# Patient Record
Sex: Male | Born: 1995 | Race: Black or African American | Hispanic: No | Marital: Single
Health system: Southern US, Community
[De-identification: ages and names within clinical notes are randomized; demographics above are authoritative.]

## PROBLEM LIST (undated history)

## (undated) DIAGNOSIS — W3400XA Accidental discharge from unspecified firearms or gun, initial encounter: Secondary | ICD-10-CM

## (undated) DIAGNOSIS — J45909 Unspecified asthma, uncomplicated: Secondary | ICD-10-CM

## (undated) DIAGNOSIS — I10 Essential (primary) hypertension: Secondary | ICD-10-CM

## (undated) HISTORY — PX: BLADDER SURGERY: SHX569

## (undated) HISTORY — PX: ABDOMINAL SURGERY: SHX537

---
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
## 2015-11-02 ENCOUNTER — Emergency Department
Admission: EM | Admit: 2015-11-02 | Discharge: 2015-11-02 | Disposition: A | Discharge status: Home / Self Care | Payer: Self-pay | Attending: Emergency Medicine | Admitting: Emergency Medicine

## 2015-11-02 ENCOUNTER — Encounter: Payer: Self-pay | Admitting: Emergency Medicine

## 2015-11-02 DIAGNOSIS — J029 Acute pharyngitis, unspecified: Secondary | ICD-10-CM | POA: Insufficient documentation

## 2015-11-02 DIAGNOSIS — F17210 Nicotine dependence, cigarettes, uncomplicated: Secondary | ICD-10-CM | POA: Insufficient documentation

## 2015-11-02 DIAGNOSIS — H9203 Otalgia, bilateral: Secondary | ICD-10-CM | POA: Insufficient documentation

## 2015-11-02 DIAGNOSIS — J45909 Unspecified asthma, uncomplicated: Secondary | ICD-10-CM | POA: Insufficient documentation

## 2015-11-02 HISTORY — DX: Unspecified asthma, uncomplicated: J45.909

## 2015-11-02 MED ORDER — MAGIC MOUTHWASH: 5.0000 mL | Freq: Four times a day (QID) | ORAL | 0 refills | Status: DC

## 2015-11-02 NOTE — ED Triage Notes (Signed)
Pt presents today and c/o cough, sore throat and swollen tonsils, and otalgia. Pt states symptoms that started approx 1 week ago but states sore throat started today. Pt in no acute distress at this time, able to maintain his own saliva and speaks with a clear voice.

## 2015-11-02 NOTE — ED Provider Notes (Signed)
[facility] Emergency Department Provider Note  ____________________________________________   First MD Initiated Contact with Patient 11/02/15 1554     (approximate)  I have reviewed the triage vital signs and the nursing notes.   HISTORY  Chief Complaint Sore Throat; Cough; and Otalgia   HPI Cory Chaney is a 20 y.o. male is here with complaint of cough, congestion and bilateral ear pain that started approximately one week ago. Patient states that today his sore throat began. He has continued to be able to swallow his own saliva, talk, eat and drink. Patient is unaware of any fever or chills. There's been no nausea or vomiting. Patient has taken Benadryl once without any relief. He rates his pain as 6/10.   Past Medical History:  Diagnosis Date  . Asthma     There are no active problems to display for this patient.   History reviewed. No pertinent surgical history.  Prior to Admission medications   Medication Sig Start Date End Date Taking? Authorizing Provider  magic mouthwash SOLN Take 5 mLs by mouth 4 (four) times daily. 11/02/15   Tommi Rumps, PA-C    Allergies Review of patient's allergies indicates no known allergies.  History reviewed. No pertinent family history.  Social History Social History  Substance Use Topics  . Smoking status: Current Every Day Smoker    Packs/day: 0.50    Types: Cigarettes  . Smokeless tobacco: Never Used  . Alcohol use 1.2 oz/week    2 Cans of beer per week    Review of Systems Constitutional: No fever/chills Eyes: No visual changes. ENT: Positive sore throat. Positive bilateral ear pain. Cardiovascular: Denies chest pain. Respiratory: Denies shortness of breath. Positive nonproductive cough. Gastrointestinal: No abdominal pain.  No nausea, no vomiting.   Musculoskeletal: Negative for back pain. Skin: Negative for rash. Neurological: Negative for headaches  10-point ROS otherwise  negative.  ____________________________________________   PHYSICAL EXAM:  VITAL SIGNS: ED Triage Vitals  Enc Vitals Group     BP 11/02/15 1541 127/76     Pulse Rate 11/02/15 1541 79     Resp 11/02/15 1541 16     Temp 11/02/15 1541 98.4 F (36.9 C)     Temp src --      SpO2 11/02/15 1541 100 %     Weight 11/02/15 1542 140 lb (63.5 kg)     Height 11/02/15 1542 5\' 11"  (1.803 m)     Head Circumference --      Peak Flow --      Pain Score 11/02/15 1551 6     Pain Loc --      Pain Edu? --      Excl. in GC? --     Constitutional: Alert and oriented. Well appearing and in no acute distress. Eyes: Conjunctivae are normal. PERRL. EOMI. Head: Atraumatic. Nose: Minimal congestion/no rhinnorhea.  EACs and TMs are clear bilaterally. Mouth/Throat: Mucous membranes are moist.  Oropharynx non-erythematous, no exudate. Mild posterior drainage. Neck: No stridor.   Hematological/Lymphatic/Immunilogical: No cervical lymphadenopathy. Cardiovascular: Normal rate, regular rhythm. Grossly normal heart sounds.  Good peripheral circulation. Respiratory: Normal respiratory effort.  No retractions. Lungs CTAB. Musculoskeletal: Moves upper and lower extremities without any difficulty. Normal gait was noted. Neurologic:  Normal speech and language. No gross focal neurologic deficits are appreciated. No gait instability. Skin:  Skin is warm, dry and intact. No rash noted. Psychiatric: Mood and affect are normal. Speech and behavior are normal.  ____________________________________________  LABS (all labs ordered are listed, but only abnormal results are displayed)  Labs Reviewed - No data to display   PROCEDURES  Procedure(s) performed: None  Procedures  Critical Care performed: No  ____________________________________________   INITIAL IMPRESSION / ASSESSMENT AND PLAN / ED COURSE  Pertinent labs & imaging results that were available during my care of the patient were reviewed by me  and considered in my medical decision making (see chart for details).    Clinical Course   Patient is aware that his strep test is negative. Patient was given a prescription for magic mouthwash to use for his throat pain. He is encouraged to continue taking Benadryl or more  preferably over-the-counter Claritin once a day. He is to [facility] if any continued problems.  ____________________________________________   FINAL CLINICAL IMPRESSION(S) / ED DIAGNOSES  Final diagnoses:  Viral pharyngitis      NEW MEDICATIONS STARTED DURING THIS VISIT:  Discharge Medication List as of 11/02/2015  4:43 PM    START taking these medications   Details  magic mouthwash SOLN Take 5 mLs by mouth 4 (four) times daily., Starting Sun 11/02/2015, Print         Note:  This document was prepared using Dragon voice recognition software and may include unintentional dictation errors.    Tommi Rumpshonda L Manahil Vanzile, PA-C 11/02/15 1846    Sharyn CreamerMark Quale, MD 11/08/15 1535

## 2015-11-02 NOTE — ED Notes (Signed)
NEGATIVE strep test

## 2015-11-02 NOTE — Discharge Instructions (Signed)
Follow[facility] if any continued problems. Tylenol or ibuprofen if needed for throat pain. Increase fluids. Magic mouthwash as needed for throat pain.

## 2016-03-30 DIAGNOSIS — W3400XA Accidental discharge from unspecified firearms or gun, initial encounter: Secondary | ICD-10-CM

## 2016-03-30 HISTORY — DX: Accidental discharge from unspecified firearms or gun, initial encounter: W34.00XA

## 2016-04-11 ENCOUNTER — Emergency Department (HOSPITAL_COMMUNITY)
Admission: EM | Admit: 2016-04-11 | Discharge: 2016-04-11 | Disposition: A | Discharge status: Home / Self Care | Payer: Self-pay | Attending: Emergency Medicine | Admitting: Emergency Medicine

## 2016-04-11 ENCOUNTER — Encounter (HOSPITAL_COMMUNITY): Payer: Self-pay | Admitting: Emergency Medicine

## 2016-04-11 DIAGNOSIS — L7622 Postprocedural hemorrhage and hematoma of skin and subcutaneous tissue following other procedure: Secondary | ICD-10-CM | POA: Insufficient documentation

## 2016-04-11 DIAGNOSIS — J45909 Unspecified asthma, uncomplicated: Secondary | ICD-10-CM | POA: Insufficient documentation

## 2016-04-11 DIAGNOSIS — T148XXA Other injury of unspecified body region, initial encounter: Secondary | ICD-10-CM

## 2016-04-11 DIAGNOSIS — F17210 Nicotine dependence, cigarettes, uncomplicated: Secondary | ICD-10-CM | POA: Insufficient documentation

## 2016-04-11 NOTE — ED Notes (Signed)
Pt and family understood dc material. NAD noted 
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
## 2016-05-04 ENCOUNTER — Encounter (HOSPITAL_COMMUNITY): Payer: Self-pay

## 2016-05-04 ENCOUNTER — Emergency Department (HOSPITAL_COMMUNITY): Payer: Self-pay

## 2016-05-04 ENCOUNTER — Emergency Department (HOSPITAL_COMMUNITY)
Admission: EM | Admit: 2016-05-04 | Discharge: 2016-05-04 | Disposition: A | Discharge status: Other Acute Inpatient Hospital | Payer: Self-pay | Attending: Emergency Medicine | Admitting: Emergency Medicine

## 2016-05-04 DIAGNOSIS — F17210 Nicotine dependence, cigarettes, uncomplicated: Secondary | ICD-10-CM | POA: Insufficient documentation

## 2016-05-04 DIAGNOSIS — W3400XD Accidental discharge from unspecified firearms or gun, subsequent encounter: Secondary | ICD-10-CM | POA: Insufficient documentation

## 2016-05-04 DIAGNOSIS — S31109D Unspecified open wound of abdominal wall, unspecified quadrant without penetration into peritoneal cavity, subsequent encounter: Secondary | ICD-10-CM | POA: Insufficient documentation

## 2016-05-04 DIAGNOSIS — L02214 Cutaneous abscess of groin: Secondary | ICD-10-CM | POA: Insufficient documentation

## 2016-05-04 DIAGNOSIS — S31139S Puncture wound of abdominal wall without foreign body, unspecified quadrant without penetration into peritoneal cavity, sequela: Secondary | ICD-10-CM

## 2016-05-04 DIAGNOSIS — Z7982 Long term (current) use of aspirin: Secondary | ICD-10-CM | POA: Insufficient documentation

## 2016-05-04 DIAGNOSIS — W3400XS Accidental discharge from unspecified firearms or gun, sequela: Secondary | ICD-10-CM

## 2016-05-04 DIAGNOSIS — J45909 Unspecified asthma, uncomplicated: Secondary | ICD-10-CM | POA: Insufficient documentation

## 2016-05-04 HISTORY — DX: Accidental discharge from unspecified firearms or gun, initial encounter: W34.00XA

## 2016-05-04 LAB — BASIC METABOLIC PANEL
Anion gap: 14 (ref 5–15)
BUN: 9 mg/dL (ref 6–20)
CHLORIDE: 102 mmol/L (ref 101–111)
CO2: 22 mmol/L (ref 22–32)
Calcium: 9.7 mg/dL (ref 8.9–10.3)
Creatinine, Ser: 0.78 mg/dL (ref 0.61–1.24)
GFR calc Af Amer: >60 mL/min (ref >60)
Glucose, Bld: 96 mg/dL (ref 65–99)
POTASSIUM: 3.6 mmol/L (ref 3.5–5.1)
SODIUM: 138 mmol/L (ref 135–145)

## 2016-05-04 LAB — CBC WITH DIFFERENTIAL/PLATELET
BASOS ABS: 0 10*3/uL (ref 0.0–0.1)
Basophils Relative: 0 %
EOS ABS: 0.2 10*3/uL (ref 0.0–0.7)
EOS PCT: 1 %
HCT: 39.7 % (ref 39.0–52.0)
Hemoglobin: 13.3 g/dL (ref 13.0–17.0)
LYMPHS ABS: 1.4 10*3/uL (ref 0.7–4.0)
LYMPHS PCT: 12 %
MCH: 29 pg (ref 26.0–34.0)
MCHC: 33.5 g/dL (ref 30.0–36.0)
MCV: 86.5 fL (ref 78.0–100.0)
Monocytes Absolute: 1.2 10*3/uL — ABNORMAL HIGH (ref 0.1–1.0)
Monocytes Relative: 10 %
NEUTROS PCT: 77 %
Neutro Abs: 9.4 10*3/uL — ABNORMAL HIGH (ref 1.7–7.7)
PLATELETS: 245 10*3/uL (ref 150–400)
RBC: 4.59 MIL/uL (ref 4.22–5.81)
RDW: 13.4 % (ref 11.5–15.5)
WBC: 12.3 10*3/uL — AB (ref 4.0–10.5)

## 2016-05-04 MED ORDER — PIPERACILLIN-TAZOBACTAM 3.375 G IVPB
3.3750 g | Freq: Once | INTRAVENOUS | Status: AC
  Administered 2016-05-04: 3.375 g via INTRAVENOUS
  Filled 2016-05-04: qty 50

## 2016-05-04 MED ORDER — MORPHINE SULFATE (PF) 4 MG/ML IV SOLN
4.0000 mg | Freq: Once | INTRAVENOUS | Status: AC
  Administered 2016-05-04: 4 mg via INTRAVENOUS
  Filled 2016-05-04: qty 1

## 2016-05-04 MED ORDER — VANCOMYCIN HCL IN DEXTROSE 1-5 GM/200ML-% IV SOLN
1000.0000 mg | Freq: Once | INTRAVENOUS | Status: AC
  Administered 2016-05-04: 1000 mg via INTRAVENOUS
  Filled 2016-05-04: qty 200

## 2016-05-04 MED ORDER — ONDANSETRON HCL 4 MG/2ML IJ SOLN
4.0000 mg | Freq: Once | INTRAMUSCULAR | Status: AC
  Administered 2016-05-04: 4 mg via INTRAVENOUS
  Filled 2016-05-04: qty 2

## 2016-05-04 MED ORDER — IOPAMIDOL (ISOVUE-300) INJECTION 61%
INTRAVENOUS | Status: AC
  Administered 2016-05-04: 100 mL
  Filled 2016-05-04: qty 100
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
## 2016-05-04 NOTE — ED Notes (Signed)
Pt assisted to bed from wheelchair. Pt assisted into gown and onto bp and o2 monitor.

## 2016-11-30 ENCOUNTER — Emergency Department: Payer: Self-pay

## 2016-11-30 ENCOUNTER — Encounter: Payer: Self-pay | Admitting: Emergency Medicine

## 2016-11-30 ENCOUNTER — Inpatient Hospital Stay
Admission: EM | Admit: 2016-11-30 | Discharge: 2016-12-03 | DRG: 699 | Disposition: A | Discharge status: Home / Self Care | Payer: Self-pay | Attending: Internal Medicine | Admitting: Internal Medicine

## 2016-11-30 DIAGNOSIS — B9620 Unspecified Escherichia coli [E. coli] as the cause of diseases classified elsewhere: Secondary | ICD-10-CM | POA: Diagnosis present

## 2016-11-30 DIAGNOSIS — Y738 Miscellaneous gastroenterology and urology devices associated with adverse incidents, not elsewhere classified: Secondary | ICD-10-CM | POA: Diagnosis present

## 2016-11-30 DIAGNOSIS — E876 Hypokalemia: Secondary | ICD-10-CM | POA: Diagnosis present

## 2016-11-30 DIAGNOSIS — N359 Urethral stricture, unspecified: Secondary | ICD-10-CM | POA: Diagnosis present

## 2016-11-30 DIAGNOSIS — Z7982 Long term (current) use of aspirin: Secondary | ICD-10-CM

## 2016-11-30 DIAGNOSIS — T83511A Infection and inflammatory reaction due to indwelling urethral catheter, initial encounter: Principal | ICD-10-CM | POA: Diagnosis present

## 2016-11-30 DIAGNOSIS — Z79899 Other long term (current) drug therapy: Secondary | ICD-10-CM

## 2016-11-30 DIAGNOSIS — N12 Tubulo-interstitial nephritis, not specified as acute or chronic: Secondary | ICD-10-CM | POA: Diagnosis present

## 2016-11-30 DIAGNOSIS — I10 Essential (primary) hypertension: Secondary | ICD-10-CM | POA: Diagnosis present

## 2016-11-30 DIAGNOSIS — F17210 Nicotine dependence, cigarettes, uncomplicated: Secondary | ICD-10-CM | POA: Diagnosis present

## 2016-11-30 DIAGNOSIS — J45909 Unspecified asthma, uncomplicated: Secondary | ICD-10-CM | POA: Diagnosis present

## 2016-11-30 HISTORY — DX: Essential (primary) hypertension: I10

## 2016-11-30 LAB — URINALYSIS, COMPLETE (UACMP) WITH MICROSCOPIC
BILIRUBIN URINE: NEGATIVE
GLUCOSE, UA: NEGATIVE mg/dL
KETONES UR: NEGATIVE mg/dL
NITRITE: POSITIVE — AB
PH: 6 (ref 5.0–8.0)
Protein, ur: NEGATIVE mg/dL
SQUAMOUS EPITHELIAL / LPF: NONE SEEN
Specific Gravity, Urine: 1.004 — ABNORMAL LOW (ref 1.005–1.030)

## 2016-11-30 LAB — CBC WITH DIFFERENTIAL/PLATELET
BASOS ABS: 0.1 10*3/uL (ref 0–0.1)
Basophils Relative: 0 %
EOS ABS: 0 10*3/uL (ref 0–0.7)
EOS PCT: 0 %
HCT: 44.7 % (ref 40.0–52.0)
Hemoglobin: 15.2 g/dL (ref 13.0–18.0)
Lymphocytes Relative: 8 %
Lymphs Abs: 1.3 10*3/uL (ref 1.0–3.6)
MCH: 29.6 pg (ref 26.0–34.0)
MCHC: 33.9 g/dL (ref 32.0–36.0)
MCV: 87.4 fL (ref 80.0–100.0)
MONO ABS: 1.9 10*3/uL — AB (ref 0.2–1.0)
Monocytes Relative: 11 %
Neutro Abs: 14.5 10*3/uL — ABNORMAL HIGH (ref 1.4–6.5)
Neutrophils Relative %: 81 %
PLATELETS: 149 10*3/uL — AB (ref 150–440)
RBC: 5.12 MIL/uL (ref 4.40–5.90)
RDW: 14.5 % (ref 11.5–14.5)
WBC: 17.8 10*3/uL — ABNORMAL HIGH (ref 3.8–10.6)

## 2016-11-30 LAB — COMPREHENSIVE METABOLIC PANEL
ALBUMIN: 3.8 g/dL (ref 3.5–5.0)
ALK PHOS: 60 U/L (ref 38–126)
ALT: 18 U/L (ref 17–63)
AST: 27 U/L (ref 15–41)
Anion gap: 12 (ref 5–15)
BILIRUBIN TOTAL: 0.7 mg/dL (ref 0.3–1.2)
BUN: 7 mg/dL (ref 6–20)
CALCIUM: 9 mg/dL (ref 8.9–10.3)
CO2: 21 mmol/L — ABNORMAL LOW (ref 22–32)
Chloride: 102 mmol/L (ref 101–111)
Creatinine, Ser: 0.86 mg/dL (ref 0.61–1.24)
GFR calc Af Amer: >60 mL/min (ref >60)
GLUCOSE: 147 mg/dL — AB (ref 65–99)
Potassium: 3 mmol/L — ABNORMAL LOW (ref 3.5–5.1)
Sodium: 135 mmol/L (ref 135–145)
TOTAL PROTEIN: 7.5 g/dL (ref 6.5–8.1)

## 2016-11-30 LAB — TROPONIN I

## 2016-11-30 LAB — LACTIC ACID, PLASMA: Lactic Acid, Venous: 1.1 mmol/L (ref 0.5–1.9)

## 2016-11-30 LAB — FIBRIN DERIVATIVES D-DIMER (ARMC ONLY): Fibrin derivatives D-dimer (ARMC): 577.02 — ABNORMAL HIGH (ref 0.00–499.00)

## 2016-11-30 MED ORDER — DEXTROSE 5 % IV SOLN
INTRAVENOUS | Status: AC
  Administered 2016-11-30: 1 g via INTRAVENOUS
  Filled 2016-11-30: qty 10

## 2016-11-30 MED ORDER — POTASSIUM CHLORIDE CRYS ER 20 MEQ PO TBCR
40.0000 meq | EXTENDED_RELEASE_TABLET | ORAL | Status: AC
  Administered 2016-11-30 (×2): 40 meq via ORAL
  Filled 2016-11-30: qty 2

## 2016-11-30 MED ORDER — SODIUM CHLORIDE 0.9 % IV SOLN: 1.0000 g | Freq: Three times a day (TID) | INTRAVENOUS | Status: DC

## 2016-11-30 MED ORDER — DEXTROSE 5 % IV SOLN
1.0000 g | INTRAVENOUS | Status: DC
  Administered 2016-11-30 – 2016-12-02 (×3): 1 g via INTRAVENOUS
  Filled 2016-11-30 (×3): qty 10

## 2016-11-30 MED ORDER — ALBUTEROL SULFATE (2.5 MG/3ML) 0.083% IN NEBU: 2.5000 mg | INHALATION_SOLUTION | RESPIRATORY_TRACT | Status: DC | PRN

## 2016-11-30 MED ORDER — TRAMADOL HCL 50 MG PO TABS
50.0000 mg | ORAL_TABLET | Freq: Four times a day (QID) | ORAL | Status: DC | PRN
  Administered 2016-12-01: 50 mg via ORAL
  Filled 2016-11-30: qty 1

## 2016-11-30 MED ORDER — ACETAMINOPHEN 325 MG PO TABS
ORAL_TABLET | ORAL | Status: AC
  Administered 2016-12-01: 650 mg via ORAL
  Filled 2016-11-30: qty 2

## 2016-11-30 MED ORDER — SODIUM CHLORIDE 0.9 % IV BOLUS (SEPSIS)
1000.0000 mL | Freq: Once | INTRAVENOUS | Status: AC
  Administered 2016-11-30: 1000 mL via INTRAVENOUS

## 2016-11-30 MED ORDER — IOPAMIDOL (ISOVUE-300) INJECTION 61%
100.0000 mL | Freq: Once | INTRAVENOUS | Status: AC | PRN
  Administered 2016-11-30: 100 mL via INTRAVENOUS
  Filled 2016-11-30: qty 100

## 2016-11-30 MED ORDER — KETOROLAC TROMETHAMINE 15 MG/ML IJ SOLN
15.0000 mg | Freq: Four times a day (QID) | INTRAMUSCULAR | Status: DC | PRN
  Administered 2016-11-30 – 2016-12-02 (×2): 15 mg via INTRAVENOUS
  Filled 2016-11-30 (×2): qty 1

## 2016-11-30 MED ORDER — ACETAMINOPHEN 325 MG RE SUPP
650.0000 mg | Freq: Four times a day (QID) | RECTAL | Status: DC | PRN
  Filled 2016-11-30: qty 2

## 2016-11-30 MED ORDER — POLYETHYLENE GLYCOL 3350 17 G PO PACK: 17.0000 g | PACK | Freq: Every day | ORAL | Status: DC | PRN

## 2016-11-30 MED ORDER — IOPAMIDOL (ISOVUE-300) INJECTION 61%
15.0000 mL | INTRAVENOUS | Status: AC
  Administered 2016-11-30 (×2): 15 mL via ORAL
  Filled 2016-11-30 (×2): qty 15

## 2016-11-30 MED ORDER — ENOXAPARIN SODIUM 40 MG/0.4ML ~~LOC~~ SOLN
40.0000 mg | SUBCUTANEOUS | Status: DC
  Administered 2016-11-30 – 2016-12-02 (×3): 40 mg via SUBCUTANEOUS
  Filled 2016-11-30 (×3): qty 0.4

## 2016-11-30 MED ORDER — CYCLOBENZAPRINE HCL 10 MG PO TABS: 5.0000 mg | ORAL_TABLET | Freq: Three times a day (TID) | ORAL | Status: DC | PRN

## 2016-11-30 MED ORDER — POTASSIUM CHLORIDE CRYS ER 20 MEQ PO TBCR
EXTENDED_RELEASE_TABLET | ORAL | Status: AC
  Administered 2016-11-30: 40 meq via ORAL
  Filled 2016-11-30: qty 2

## 2016-11-30 MED ORDER — ONDANSETRON HCL 4 MG/2ML IJ SOLN
4.0000 mg | Freq: Four times a day (QID) | INTRAMUSCULAR | Status: DC | PRN
  Administered 2016-12-01: 4 mg via INTRAVENOUS
  Filled 2016-11-30: qty 2

## 2016-11-30 MED ORDER — ACETAMINOPHEN 325 MG PO TABS
650.0000 mg | ORAL_TABLET | Freq: Four times a day (QID) | ORAL | Status: DC | PRN
  Administered 2016-11-30 – 2016-12-01 (×3): 650 mg via ORAL
  Filled 2016-11-30 (×2): qty 2

## 2016-11-30 MED ORDER — ONDANSETRON HCL 4 MG PO TABS: 4.0000 mg | ORAL_TABLET | Freq: Four times a day (QID) | ORAL | Status: DC | PRN

## 2016-11-30 NOTE — ED Notes (Signed)
Urinary drainage bag attached to foley, clear yellow urine noted in bag.

## 2016-11-30 NOTE — ED Notes (Signed)
Pt to restroom
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
## 2016-11-30 NOTE — ED Notes (Signed)
Report given to oncoming shift RN. Patient in stable condition prior. Patient care transferred. 

## 2016-11-30 NOTE — ED Notes (Signed)
CT notified that pt has finished oral contrast

## 2016-11-30 NOTE — ED Provider Notes (Signed)
-----------------------------------------   4:04 PM on 11/30/2016 -----------------------------------------  EKG reviewed and interpreted by myself shows normal sinus rhythm at 77 bpm, normal QRS, normal axis, normal intervals, no concerning ST changes.   Minna Antis, MD 11/30/16 [phone]
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
## 2016-12-01 LAB — CBC
HEMATOCRIT: 42.6 % (ref 40.0–52.0)
HEMOGLOBIN: 14.7 g/dL (ref 13.0–18.0)
MCH: 30.2 pg (ref 26.0–34.0)
MCHC: 34.5 g/dL (ref 32.0–36.0)
MCV: 87.5 fL (ref 80.0–100.0)
Platelets: 134 10*3/uL — ABNORMAL LOW (ref 150–440)
RBC: 4.86 MIL/uL (ref 4.40–5.90)
RDW: 14.8 % — ABNORMAL HIGH (ref 11.5–14.5)
WBC: 13.6 10*3/uL — AB (ref 3.8–10.6)

## 2016-12-01 LAB — BASIC METABOLIC PANEL
ANION GAP: 9 (ref 5–15)
BUN: 7 mg/dL (ref 6–20)
CALCIUM: 8.8 mg/dL — AB (ref 8.9–10.3)
CO2: 21 mmol/L — ABNORMAL LOW (ref 22–32)
Chloride: 106 mmol/L (ref 101–111)
Creatinine, Ser: 0.78 mg/dL (ref 0.61–1.24)
GLUCOSE: 101 mg/dL — AB (ref 65–99)
POTASSIUM: 3.8 mmol/L (ref 3.5–5.1)
Sodium: 136 mmol/L (ref 135–145)

## 2016-12-01 NOTE — Progress Notes (Signed)
[facility] - [place] at [facility]   PATIENT NAME: Cory Chaney    MR#:  [id number]  DATE OF BIRTH:  06-Sep-1995  SUBJECTIVE:  CHIEF COMPLAINT:   Chief Complaint  Patient presents with  . Fever   Ever 102 this morning, better flank pain REVIEW OF SYSTEMS:  Review of Systems  Constitutional: Positive for fever. Negative for chills and malaise/fatigue.  HENT: Negative for sore throat.   Eyes: Negative for blurred vision and double vision.  Respiratory: Negative for cough, hemoptysis, shortness of breath, wheezing and stridor.   Cardiovascular: Negative for chest pain, palpitations, orthopnea and leg swelling.  Gastrointestinal: Negative for abdominal pain, blood in stool, diarrhea, melena, nausea and vomiting.  Genitourinary: Negative for dysuria, flank pain and hematuria.  Musculoskeletal: Negative for back pain and joint pain.  Neurological: Negative for dizziness, sensory change, focal weakness, seizures, loss of consciousness, weakness and headaches.  Endo/Heme/Allergies: Negative for polydipsia.  Psychiatric/Behavioral: Negative for depression. The patient is not nervous/anxious.     DRUG ALLERGIES:  No Known Allergies VITALS:  Blood pressure 124/74, pulse 80, temperature 99.8 F (37.7 C), resp. rate 18, height  (1.803 m), weight 144 lb (65.3 kg), SpO2 100 %. PHYSICAL EXAMINATION:  Physical Exam  Constitutional: He is oriented to person, place, and time and well-developed, well-nourished, and in no distress.  HENT:  Head: Normocephalic.  Mouth/Throat: Oropharynx is clear and moist.  Eyes: Pupils are equal, round, and reactive to light. Conjunctivae and EOM are normal. No scleral icterus.  Neck: Normal range of motion. Neck supple. No JVD present. No tracheal deviation present.  Cardiovascular: Normal rate, regular rhythm and normal heart sounds.  Exam reveals no gallop.   No murmur heard. Pulmonary/Chest: Effort normal and breath sounds  normal. No respiratory distress. He has no wheezes. He has no rales.  Abdominal: Soft. Bowel sounds are normal. He exhibits no distension. There is no tenderness. There is no rebound.  Genitourinary:  Genitourinary Comments: Suprapubic catheter in situ.  Musculoskeletal: Normal range of motion. He exhibits no edema or tenderness.  Neurological: He is alert and oriented to person, place, and time. No cranial nerve deficit.  Skin: No rash noted. No erythema.  Psychiatric: Affect normal.   LABORATORY PANEL:  Male CBC  Recent Labs Lab 12/01/16 0352  WBC 13.6*  HGB 14.7  HCT 42.6  PLT 134*   ------------------------------------------------------------------------------------------------------------------ Chemistries   Recent Labs Lab 11/30/16 1538 12/01/16 0352  NA 135 136  K 3.0* 3.8  CL 102 106  CO2 21* 21*  GLUCOSE 147* 101*  BUN 7 7  CREATININE 0.86 0.78  CALCIUM 9.0 8.8*  AST 27  --   ALT 18  --   ALKPHOS 60  --   BILITOT 0.7  --    RADIOLOGY:  Dg Chest 2 View  Result Date: 11/30/2016 CLINICAL DATA:  Initial evaluation for acute cough, fever. EXAM: CHEST  2 VIEW COMPARISON:  Prior CT from 12/17/2011. FINDINGS: The cardiac and mediastinal silhouettes are stable in size and contour, and remain within normal limits. The lungs are normally inflated. No airspace consolidation, pleural effusion, or pulmonary edema is identified. There is no pneumothorax. No acute osseous abnormality identified. Remote right clavicular fracture noted. IMPRESSION: No active cardiopulmonary disease. Electronically Signed   By: Rise Mu M.D.   On: 11/30/2016 15:41   Ct Abdomen Pelvis W Contrast  Result Date: 11/30/2016 CLINICAL DATA:  Patient had surgery on his urethra last Thursday complaining of fever and  chills since Saturday. EXAM: CT ABDOMEN AND PELVIS WITH CONTRAST TECHNIQUE: Multidetector CT imaging of the abdomen and pelvis was performed using the standard protocol following  bolus administration of intravenous contrast. CONTRAST:  ISOVUE-300 IOPAMIDOL (ISOVUE-300) INJECTION 61% COMPARISON:  None. FINDINGS: Lower chest: No acute abnormality. Hepatobiliary: There is minimal low density of liver near falciform ligament due to focal fatty infiltration of liver. The liver is otherwise unremarkable. The gallbladder is normal. The biliary tree is normal. Pancreas: Unremarkable. No pancreatic ductal dilatation or surrounding inflammatory changes. Spleen: Normal in size without focal abnormality. Adrenals/Urinary Tract: There are patchy decreased enhancement involving the left kidney more prominently in the upper and midpole. The right kidney is normal. There is no hydronephrosis bilaterally. The adrenal glands are normal. The bladder is partially decompressed with a suprapubic catheter in place. Stomach/Bowel: Stomach is within normal limits. The appendix is not definitely seen but no inflammation is noted around cecum. No evidence of bowel wall thickening, distention, or inflammatory changes. Vascular/Lymphatic: No significant vascular findings are present. No enlarged abdominal or pelvic lymph nodes. Reproductive: Prostate is unremarkable. Other: Small amount of free fluid is identified in the pelvis likely postsurgical. Musculoskeletal: No acute or significant osseous findings. IMPRESSION: Patchy decreased enhancement involving left kidney. This is nonspecific but can be seen in pyelonephritis. Clinical correlation regarding urinalysis is recommended. Electronically Signed   By: Sherian Rein M.D.   On: 11/30/2016 17:36   ASSESSMENT AND PLAN:   * left pyelonephritis with leukocytosis, related to Suprapubic catheter continue ceftriaxone and IVF. Leukocytosis is improving. Follow-up CBC,  blood and urine cultures. Pain medications added as needed.  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 Patient has follow-up with urology at [facility].  * Hypokalemia. Improved with oral potassium supplements.  * Asthma. Nebulizers prn.  Tobacco abuse. Smoking cessation was counseled for 4 minutes.  All the records are reviewed and case discussed with Care Management/Social Worker. Management plans discussed with the patient, family and they are in agreement.  CODE STATUS: Full Code  TOTAL TIME TAKING CARE OF THIS PATIENT: 36 minutes.   More than 50% of the time was spent in counseling/coordination of care: YES  POSSIBLE D/C IN 2 DAYS, DEPENDING ON CLINICAL CONDITION.   Shaune Pollack M.D on 12/01/2016 at 3:27 PM  Between 7am to 6pm - Pager - [phone]  After 6pm go to [email or web address] - Therapist, nutritional Hospitalists

## 2016-12-02 LAB — HIV ANTIBODY (ROUTINE TESTING W REFLEX): HIV SCREEN 4TH GENERATION: NONREACTIVE

## 2016-12-02 NOTE — Care Management (Signed)
Patient very limited in his verbal responses to CM during interaction.  Little eye contact.  Lives his great grandmother and his grandmother helps pay for medications.  His girlfriend will transport him home. Provided patient with application to  [facility].  It is very likely patient will require assistance with medications at discahrge

## 2016-12-02 NOTE — Progress Notes (Signed)
[facility] - [place] at [facility]   PATIENT NAME: Cory Chaney    MR#:  [id number]  DATE OF BIRTH:  1995-10-16  SUBJECTIVE:  CHIEF COMPLAINT:   Chief Complaint  Patient presents with  . Fever    Fever of 101.5 last night. No other complaints. REVIEW OF SYSTEMS:  Review of Systems  Constitutional: Positive for fever. Negative for chills and malaise/fatigue.  HENT: Negative for sore throat.   Eyes: Negative for blurred vision and double vision.  Respiratory: Negative for cough, hemoptysis, shortness of breath, wheezing and stridor.   Cardiovascular: Negative for chest pain, palpitations, orthopnea and leg swelling.  Gastrointestinal: Negative for abdominal pain, blood in stool, diarrhea, melena, nausea and vomiting.  Genitourinary: Negative for dysuria, flank pain and hematuria.  Musculoskeletal: Negative for back pain and joint pain.  Neurological: Negative for dizziness, sensory change, focal weakness, seizures, loss of consciousness, weakness and headaches.  Endo/Heme/Allergies: Negative for polydipsia.  Psychiatric/Behavioral: Negative for depression. The patient is not nervous/anxious.     DRUG ALLERGIES:  No Known Allergies VITALS:  Blood pressure 115/80, pulse 75, temperature 98.2 F (36.8 C), resp. rate 18, height  (1.803 m), weight 140 lb 4.8 oz (63.6 kg), SpO2 100 %. PHYSICAL EXAMINATION:  Physical Exam  Constitutional: He is oriented to person, place, and time and well-developed, well-nourished, and in no distress.  HENT:  Head: Normocephalic.  Mouth/Throat: Oropharynx is clear and moist.  Eyes: Pupils are equal, round, and reactive to light. Conjunctivae and EOM are normal. No scleral icterus.  Neck: Normal range of motion. Neck supple. No JVD present. No tracheal deviation present.  Cardiovascular: Normal rate, regular rhythm and normal heart sounds.  Exam reveals no gallop.   No murmur heard. Pulmonary/Chest: Effort normal and  breath sounds normal. No respiratory distress. He has no wheezes. He has no rales.  Abdominal: Soft. Bowel sounds are normal. He exhibits no distension. There is no tenderness. There is no rebound.  Genitourinary:  Genitourinary Comments: Suprapubic catheter in situ.  Musculoskeletal: Normal range of motion. He exhibits no edema or tenderness.  Neurological: He is alert and oriented to person, place, and time. No cranial nerve deficit.  Skin: No rash noted. No erythema.  Psychiatric: Affect normal.   LABORATORY PANEL:  Male CBC  Recent Labs Lab 12/01/16 0352  WBC 13.6*  HGB 14.7  HCT 42.6  PLT 134*   ------------------------------------------------------------------------------------------------------------------ Chemistries   Recent Labs Lab 11/30/16 1538 12/01/16 0352  NA 135 136  K 3.0* 3.8  CL 102 106  CO2 21* 21*  GLUCOSE 147* 101*  BUN 7 7  CREATININE 0.86 0.78  CALCIUM 9.0 8.8*  AST 27  --   ALT 18  --   ALKPHOS 60  --   BILITOT 0.7  --    RADIOLOGY:  No results found. ASSESSMENT AND PLAN:   * left pyelonephritis with leukocytosis, related to Suprapubic catheter continue ceftriaxone and IVF. Leukocytosis is improving. Follow-up CBC,  Blood cultures are negative so far, urine cultures: GNR. Pain medications as needed.  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 Patient has follow-up with urology at [facility].  * Hypokalemia. Improved with oral potassium supplements.  * Asthma. Nebulizers prn.  Tobacco abuse. Smoking cessation was counseled for 4 minutes.  All the records are reviewed and case discussed with Care Management/Social Worker. Management plans discussed with the patient, family and they are in agreement.  CODE STATUS: Full Code  TOTAL  TIME TAKING CARE OF THIS PATIENT: 25 minutes.   More than 50% of the time was spent in counseling/coordination of care: YES  POSSIBLE D/C IN 1-2 DAYS, DEPENDING  ON CLINICAL CONDITION.   Shaune Pollack M.D on 12/02/2016 at 3:12 PM  Between 7am to 6pm - Pager - [phone]  After 6pm go to [email or web address] - Therapist, nutritional Hospitalists

## 2016-12-02 NOTE — Progress Notes (Signed)
Patient alert and oriented, vss, no telemetry.  Patient independent in the room.  No significant events this shift.  Continue to monitor.

## 2016-12-03 LAB — URINE CULTURE
Culture: >=100000 — AB
SPECIAL REQUESTS: NORMAL

## 2016-12-03 MED ORDER — CIPROFLOXACIN HCL 500 MG PO TABS
500.0000 mg | ORAL_TABLET | Freq: Two times a day (BID) | ORAL | Status: DC
  Filled 2016-12-03: qty 1

## 2016-12-03 MED ORDER — CYCLOBENZAPRINE HCL 5 MG PO TABS: 5.0000 mg | ORAL_TABLET | Freq: Three times a day (TID) | ORAL | 0 refills | Status: AC | PRN

## 2016-12-03 MED ORDER — CIPROFLOXACIN HCL 500 MG PO TABS: 500.0000 mg | ORAL_TABLET | Freq: Two times a day (BID) | ORAL | 0 refills | Status: DC

## 2016-12-03 NOTE — Discharge Instructions (Signed)
Smoking cessation  

## 2016-12-03 NOTE — Progress Notes (Signed)
Patient alert and oriented, vss, no complaints of pain.  Patient has 1 new medication and able to repeat back education on this medication including when to take it.    No questions at this time.  Patient to be escorted out of hospital via wheelchair by volunteers.
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
## 2016-12-05 LAB — CULTURE, BLOOD (ROUTINE X 2)
CULTURE: NO GROWTH
Culture: NO GROWTH
SPECIAL REQUESTS: ADEQUATE
Special Requests: ADEQUATE

## 2017-02-22 ENCOUNTER — Encounter: Payer: Self-pay | Admitting: Emergency Medicine

## 2017-02-22 ENCOUNTER — Emergency Department
Admission: EM | Admit: 2017-02-22 | Discharge: 2017-02-22 | Disposition: A | Discharge status: Home / Self Care | Payer: Self-pay | Attending: Emergency Medicine | Admitting: Emergency Medicine

## 2017-02-22 DIAGNOSIS — Y828 Other medical devices associated with adverse incidents: Secondary | ICD-10-CM | POA: Insufficient documentation

## 2017-02-22 DIAGNOSIS — R339 Retention of urine, unspecified: Secondary | ICD-10-CM | POA: Insufficient documentation

## 2017-02-22 DIAGNOSIS — F17210 Nicotine dependence, cigarettes, uncomplicated: Secondary | ICD-10-CM | POA: Insufficient documentation

## 2017-02-22 DIAGNOSIS — T83090A Other mechanical complication of cystostomy catheter, initial encounter: Secondary | ICD-10-CM | POA: Insufficient documentation

## 2017-02-22 DIAGNOSIS — T83510A Infection and inflammatory reaction due to cystostomy catheter, initial encounter: Secondary | ICD-10-CM | POA: Insufficient documentation

## 2017-02-22 DIAGNOSIS — J45909 Unspecified asthma, uncomplicated: Secondary | ICD-10-CM | POA: Insufficient documentation

## 2017-02-22 DIAGNOSIS — Z79899 Other long term (current) drug therapy: Secondary | ICD-10-CM | POA: Insufficient documentation

## 2017-02-22 DIAGNOSIS — N390 Urinary tract infection, site not specified: Secondary | ICD-10-CM | POA: Insufficient documentation

## 2017-02-22 DIAGNOSIS — I10 Essential (primary) hypertension: Secondary | ICD-10-CM | POA: Insufficient documentation

## 2017-02-22 LAB — BASIC METABOLIC PANEL
Anion gap: 10 (ref 5–15)
BUN: 19 mg/dL (ref 6–20)
CALCIUM: 9.7 mg/dL (ref 8.9–10.3)
CO2: 26 mmol/L (ref 22–32)
CREATININE: 0.87 mg/dL (ref 0.61–1.24)
Chloride: 100 mmol/L — ABNORMAL LOW (ref 101–111)
GFR calc Af Amer: >60 mL/min (ref >60)
GFR calc non Af Amer: >60 mL/min (ref >60)
GLUCOSE: 101 mg/dL — AB (ref 65–99)
Potassium: 3.8 mmol/L (ref 3.5–5.1)
Sodium: 136 mmol/L (ref 135–145)

## 2017-02-22 LAB — CBC WITH DIFFERENTIAL/PLATELET
BASOS PCT: 1 %
Basophils Absolute: 0.1 10*3/uL (ref 0–0.1)
EOS ABS: 0.2 10*3/uL (ref 0–0.7)
Eosinophils Relative: 1 %
HEMATOCRIT: 47 % (ref 40.0–52.0)
Hemoglobin: 16.1 g/dL (ref 13.0–18.0)
Lymphocytes Relative: 26 %
Lymphs Abs: 3 10*3/uL (ref 1.0–3.6)
MCH: 31.2 pg (ref 26.0–34.0)
MCHC: 34.1 g/dL (ref 32.0–36.0)
MCV: 91.3 fL (ref 80.0–100.0)
MONO ABS: 0.8 10*3/uL (ref 0.2–1.0)
MONOS PCT: 7 %
Neutro Abs: 7.3 10*3/uL — ABNORMAL HIGH (ref 1.4–6.5)
Neutrophils Relative %: 65 %
Platelets: 217 10*3/uL (ref 150–440)
RBC: 5.15 MIL/uL (ref 4.40–5.90)
RDW: 14.3 % (ref 11.5–14.5)
WBC: 11.3 10*3/uL — ABNORMAL HIGH (ref 3.8–10.6)

## 2017-02-22 LAB — URINALYSIS, COMPLETE (UACMP) WITH MICROSCOPIC
BILIRUBIN URINE: NEGATIVE
Bacteria, UA: NONE SEEN
GLUCOSE, UA: NEGATIVE mg/dL
KETONES UR: 5 mg/dL — AB
NITRITE: NEGATIVE
PH: 7 (ref 5.0–8.0)
Protein, ur: 30 mg/dL — AB
Specific Gravity, Urine: 1.019 (ref 1.005–1.030)
Squamous Epithelial / LPF: NONE SEEN

## 2017-02-22 MED ORDER — CEFTRIAXONE SODIUM IN DEXTROSE 20 MG/ML IV SOLN
1.0000 g | Freq: Once | INTRAVENOUS | Status: AC
  Administered 2017-02-22: 1 g via INTRAVENOUS
  Filled 2017-02-22: qty 50

## 2017-02-22 MED ORDER — SULFAMETHOXAZOLE-TRIMETHOPRIM 800-160 MG PO TABS: 1.0000 | ORAL_TABLET | Freq: Two times a day (BID) | ORAL | 0 refills | Status: AC
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
## 2017-02-25 LAB — URINE CULTURE: Culture: >=100000 — AB

## 2017-02-26 NOTE — Progress Notes (Signed)
Cory Chaney is a 21 year old male seen in the ER on 02/22/17 with the chief complaint of severe lower abdominal pain/testicle pain. Patient has a suprapubic catheter since January 2018. Patient was diagnosed with the following:  Blocked suprapubic catheter, initial encounter [facility](HCC)  Urinary tract infection associated with cystostomy catheter, initial encounter [facility](HCC)    Patient had urine cultures drawn and was prescribed Septra and informed to follow up with his urologist on 12/19.  Urine cultures resulted on 12/21 showing the following:   Culture Abnormal  >=100,000 COLONIES/mL ENTEROCOCCUS FAECALIS  >=100,000 COLONIES/mL VIRIDANS STREPTOCOCCUS      Report Status 02/25/2017 FINAL   Organism ID, Bacteria ENTEROCOCCUS FAECALIS Abnormal    Resulting Agency [facility]  Susceptibility    Enterococcus faecalis    MIC    AMPICILLIN <=2 SENSITIVE  Sensitive    LEVOFLOXACIN 1 SENSITIVE  Sensitive    NITROFURANTOIN <=16 SENSIT... Sensitive    VANCOMYCIN 1 SENSITIVE  Sensitive      Spoke with MD Malinda and recommended switching patient to Cipro 500 mg PO BID x 7 days. MD Darnelle CatalanMalinda agreed with the recommended. Attempted calling patient to inform him of the change however the patient's voicemail box is full and messages can't be left @ this time.   Will attempt calling again tomorrow.  Cory Chaney, PharmD

## 2017-02-28 NOTE — Progress Notes (Signed)
Cory Chaney is a 21 year old male seen in the ER on 02/22/17 with the chief complaint of severe lower abdominal pain/testicle pain. Patient has a suprapubic catheter since January 2018. Patient was diagnosed with the following:  Blocked suprapubic catheter, initial encounter [facility](HCC)  Urinary tract infection associated with cystostomy catheter, initial encounter [facility](HCC)    Patient had urine cultures drawn and was prescribed Septra and informed to follow up with his urologist on 12/19.  Urine cultures resulted on 12/21 showing the following:   Culture Abnormal  >=100,000 COLONIES/mL ENTEROCOCCUS FAECALIS  >=100,000 COLONIES/mL VIRIDANS STREPTOCOCCUS      Report Status 02/25/2017 FINAL   Organism ID, Bacteria ENTEROCOCCUS FAECALIS Abnormal    Resulting Agency [facility]  Susceptibility    Enterococcus faecalis    MIC    AMPICILLIN <=2 SENSITIVE  Sensitive    LEVOFLOXACIN 1 SENSITIVE  Sensitive    NITROFURANTOIN <=16 SENSIT... Sensitive    VANCOMYCIN 1 SENSITIVE  Sensitive      Spoke with MD Malinda and recommended switching patient to Cipro 500 mg PO BID x 7 days. MD Darnelle CatalanMalinda agreed with the recommended. Attempted calling patient to inform him of the change however the patient's voicemail box is full and messages can't be left @ this time.   12/24: Able to leave voicemail. Message left asking patient to call pharmacy @ earliest convenience.   Demetrius Charityeldrin D. Zykiria Bruening, PharmD

## 2017-03-01 NOTE — Progress Notes (Signed)
This patient had a previous visit to [facility] Emergency Department. The results of a culture report from that visit are: Recent Results (from the past 240 hour(s))  Urine Culture     Status: Abnormal   Collection Time: 02/22/17  7:04 PM  Result Value Ref Range Status   Specimen Description   Final    URINE, RANDOM Performed at [facility], [address] [place], [place] [address]    Special Requests   Final    NONE Performed at [facility], [address] [place], [place] [address]    Culture (A)  Final    >=100,000 COLONIES/mL ENTEROCOCCUS FAECALIS >=100,000 COLONIES/mL VIRIDANS STREPTOCOCCUS    Report Status 02/25/2017 FINAL  Final   Organism ID, Bacteria ENTEROCOCCUS FAECALIS (A)  Final      Susceptibility   Enterococcus faecalis - MIC*    AMPICILLIN <=2 SENSITIVE Sensitive     LEVOFLOXACIN 1 SENSITIVE Sensitive     NITROFURANTOIN <=16 SENSITIVE Sensitive     VANCOMYCIN 1 SENSITIVE Sensitive     * >=100,000 COLONIES/mL ENTEROCOCCUS FAECALIS     Ciprofloxacin 500mg  po bid x 7 days  Changes authorized by Dr Hurshel Partypaul malinda   Called into [facility] on [address] [place], [place] at the request of Mr Nunzio CoryLyons.   Luan PullingGarrett Chisum Habenicht, PharmD, MBA, [employer] Cla[employer]orneBCGP Clinical Pharmacist [facility]

## 2017-05-07 ENCOUNTER — Other Ambulatory Visit: Payer: Self-pay

## 2017-05-07 ENCOUNTER — Emergency Department
Admission: EM | Admit: 2017-05-07 | Discharge: 2017-05-07 | Disposition: A | Discharge status: Home / Self Care | Payer: Self-pay | Attending: Emergency Medicine | Admitting: Emergency Medicine

## 2017-05-07 ENCOUNTER — Encounter: Payer: Self-pay | Admitting: Emergency Medicine

## 2017-05-07 DIAGNOSIS — T83091A Other mechanical complication of indwelling urethral catheter, initial encounter: Secondary | ICD-10-CM | POA: Insufficient documentation

## 2017-05-07 DIAGNOSIS — Y828 Other medical devices associated with adverse incidents: Secondary | ICD-10-CM | POA: Insufficient documentation

## 2017-05-07 DIAGNOSIS — I10 Essential (primary) hypertension: Secondary | ICD-10-CM | POA: Insufficient documentation

## 2017-05-07 DIAGNOSIS — J45909 Unspecified asthma, uncomplicated: Secondary | ICD-10-CM | POA: Insufficient documentation

## 2017-05-07 DIAGNOSIS — Z79899 Other long term (current) drug therapy: Secondary | ICD-10-CM | POA: Insufficient documentation

## 2017-05-07 DIAGNOSIS — N390 Urinary tract infection, site not specified: Secondary | ICD-10-CM

## 2017-05-07 LAB — URINALYSIS, COMPLETE (UACMP) WITH MICROSCOPIC
BILIRUBIN URINE: NEGATIVE
Glucose, UA: NEGATIVE mg/dL
KETONES UR: NEGATIVE mg/dL
Nitrite: NEGATIVE
Protein, ur: 100 mg/dL — AB
SPECIFIC GRAVITY, URINE: 1.02 (ref 1.005–1.030)
pH: 6 (ref 5.0–8.0)

## 2017-05-07 MED ORDER — CIPROFLOXACIN HCL 500 MG PO TABS: 500.0000 mg | ORAL_TABLET | Freq: Two times a day (BID) | ORAL | 0 refills | Status: AC

## 2017-05-07 MED ORDER — CIPROFLOXACIN HCL 500 MG PO TABS
500.0000 mg | ORAL_TABLET | Freq: Once | ORAL | Status: AC
  Administered 2017-05-07: 500 mg via ORAL
  Filled 2017-05-07: qty 1

## 2017-05-07 NOTE — ED Notes (Signed)
Attempted to irrigate foley per MD order. Unable to flush, water leaking at connection point on attempt. MD notified. Will change catheter, supplies at bedside.
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
## 2017-05-10 LAB — URINE CULTURE

## 2017-09-02 ENCOUNTER — Emergency Department: Payer: Self-pay

## 2017-09-02 ENCOUNTER — Emergency Department
Admission: EM | Admit: 2017-09-02 | Discharge: 2017-09-02 | Disposition: A | Discharge status: Home / Self Care | Payer: Self-pay | Attending: Emergency Medicine | Admitting: Emergency Medicine

## 2017-09-02 DIAGNOSIS — F17210 Nicotine dependence, cigarettes, uncomplicated: Secondary | ICD-10-CM | POA: Insufficient documentation

## 2017-09-02 DIAGNOSIS — T83010A Breakdown (mechanical) of cystostomy catheter, initial encounter: Secondary | ICD-10-CM | POA: Insufficient documentation

## 2017-09-02 DIAGNOSIS — Y732 Prosthetic and other implants, materials and accessory gastroenterology and urology devices associated with adverse incidents: Secondary | ICD-10-CM | POA: Insufficient documentation

## 2017-09-02 MED ORDER — CEPHALEXIN 500 MG PO CAPS: 500.0000 mg | ORAL_CAPSULE | Freq: Two times a day (BID) | ORAL | 0 refills | Status: AC
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
## 2017-09-02 NOTE — ED Notes (Signed)
Unable to flush catheter and unable to pull any urine from it.
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
## 2017-09-02 NOTE — ED Notes (Signed)
In to assist Dr Manson PasseyBrown placing a new suprapubic catheter. Pt tolerated well. Thick sludge and sediment heavy urine draining from catheter. Blood noted as well.

## 2017-10-08 ENCOUNTER — Emergency Department (HOSPITAL_COMMUNITY)
Admission: EM | Admit: 2017-10-08 | Discharge: 2017-10-08 | Disposition: A | Discharge status: Home / Self Care | Payer: Self-pay | Attending: Emergency Medicine | Admitting: Emergency Medicine

## 2017-10-08 ENCOUNTER — Other Ambulatory Visit: Payer: Self-pay

## 2017-10-08 DIAGNOSIS — J45909 Unspecified asthma, uncomplicated: Secondary | ICD-10-CM | POA: Insufficient documentation

## 2017-10-08 DIAGNOSIS — I10 Essential (primary) hypertension: Secondary | ICD-10-CM | POA: Insufficient documentation

## 2017-10-08 DIAGNOSIS — T83198A Other mechanical complication of other urinary devices and implants, initial encounter: Secondary | ICD-10-CM | POA: Insufficient documentation

## 2017-10-08 DIAGNOSIS — F17210 Nicotine dependence, cigarettes, uncomplicated: Secondary | ICD-10-CM | POA: Insufficient documentation

## 2017-10-08 DIAGNOSIS — T83010A Breakdown (mechanical) of cystostomy catheter, initial encounter: Secondary | ICD-10-CM

## 2017-10-08 DIAGNOSIS — Y829 Unspecified medical devices associated with adverse incidents: Secondary | ICD-10-CM | POA: Insufficient documentation

## 2017-10-08 NOTE — ED Triage Notes (Signed)
Pt to ER for evaluation of urinary catheter that isn't draining. Pt has foley in place, reports emptied leg bag at 10 am, and since it has not drained anything, pt in pain, bladder scan >500 cc urine.

## 2017-10-08 NOTE — Discharge Instructions (Addendum)
We have replaced your suprapubic catheter today. Follow up with your urologist for recheck. Return here as needed.
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
## 2017-10-16 ENCOUNTER — Emergency Department
Admission: EM | Admit: 2017-10-16 | Discharge: 2017-10-16 | Disposition: A | Discharge status: Home / Self Care | Payer: Self-pay | Attending: Emergency Medicine | Admitting: Emergency Medicine

## 2017-10-16 ENCOUNTER — Encounter: Payer: Self-pay | Admitting: Emergency Medicine

## 2017-10-16 ENCOUNTER — Other Ambulatory Visit: Payer: Self-pay

## 2017-10-16 DIAGNOSIS — J45909 Unspecified asthma, uncomplicated: Secondary | ICD-10-CM | POA: Insufficient documentation

## 2017-10-16 DIAGNOSIS — N390 Urinary tract infection, site not specified: Secondary | ICD-10-CM | POA: Insufficient documentation

## 2017-10-16 DIAGNOSIS — I10 Essential (primary) hypertension: Secondary | ICD-10-CM | POA: Insufficient documentation

## 2017-10-16 DIAGNOSIS — R319 Hematuria, unspecified: Secondary | ICD-10-CM | POA: Insufficient documentation

## 2017-10-16 DIAGNOSIS — Z79899 Other long term (current) drug therapy: Secondary | ICD-10-CM | POA: Insufficient documentation

## 2017-10-16 DIAGNOSIS — F17210 Nicotine dependence, cigarettes, uncomplicated: Secondary | ICD-10-CM | POA: Insufficient documentation

## 2017-10-16 LAB — URINALYSIS, COMPLETE (UACMP) WITH MICROSCOPIC
Bilirubin Urine: NEGATIVE
GLUCOSE, UA: NEGATIVE mg/dL
Ketones, ur: 5 mg/dL — AB
Nitrite: POSITIVE — AB
PROTEIN: 100 mg/dL — AB
RBC / HPF: >50 RBC/hpf — ABNORMAL HIGH (ref 0–5)
SQUAMOUS EPITHELIAL / LPF: NONE SEEN (ref 0–5)
Specific Gravity, Urine: 1.025 (ref 1.005–1.030)
pH: 7 (ref 5.0–8.0)

## 2017-10-16 MED ORDER — SULFAMETHOXAZOLE-TRIMETHOPRIM 800-160 MG PO TABS: 1.0000 | ORAL_TABLET | Freq: Two times a day (BID) | ORAL | 0 refills | Status: DC

## 2017-10-16 MED ORDER — DOXYCYCLINE HYCLATE 100 MG PO TABS: 100.0000 mg | ORAL_TABLET | Freq: Two times a day (BID) | ORAL | 0 refills | Status: DC

## 2017-10-16 MED ORDER — SODIUM CHLORIDE 0.9 % IV SOLN
1.0000 g | Freq: Once | INTRAVENOUS | Status: AC
  Administered 2017-10-16: 1 g via INTRAVENOUS
  Filled 2017-10-16 (×2): qty 10

## 2017-10-16 MED ORDER — SODIUM CHLORIDE 0.9 % IV BOLUS
1000.0000 mL | Freq: Once | INTRAVENOUS | Status: AC
  Administered 2017-10-16: 1000 mL via INTRAVENOUS
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
## 2023-01-20 ENCOUNTER — Ambulatory Visit: Payer: Self-pay | Admitting: Family Medicine

## 2023-01-20 DIAGNOSIS — Z113 Encounter for screening for infections with a predominantly sexual mode of transmission: Secondary | ICD-10-CM

## 2023-01-20 DIAGNOSIS — N341 Nonspecific urethritis: Secondary | ICD-10-CM

## 2023-01-20 LAB — GRAM STAIN

## 2023-01-20 MED ORDER — DOXYCYCLINE HYCLATE 100 MG PO TABS: 100.0000 mg | ORAL_TABLET | Freq: Two times a day (BID) | ORAL | Status: AC

## 2023-01-20 NOTE — Progress Notes (Signed)
[facility] Department STI clinic/screening visit  Subjective:  Theo A Schmick is a 27 y.o. male being seen today for an STI screening visit. The patient reports they do have symptoms.    Patient has the following medical conditions:   Patient Active Problem List   Diagnosis Date Noted   Pyelonephritis 11/30/2016     Chief Complaint  Patient presents with   SEXUALLY TRANSMITTED DISEASE    Has symptoms since Friday     HPI  Patient reports  to clinic with a 1 week hx of genital pain, burning with urination  Last HIV test per patient/review of record was No results found for: "HMHIVSCREEN"  Lab Results  Component Value Date   HIV Non Reactive 11/30/2016    Last HEPC test per patient/review of record was No results found for: "HMHEPCSCREEN" No components found for: "HEPC"   Last HEPB test per patient/review of record was No components found for: "HMHEPBSCREEN" No components found for: "HEPC"   Does the patient or their partner desires a pregnancy in the next year? No  Screening for MPX risk: Does the patient have an unexplained rash? No Is the patient MSM? No Does the patient endorse multiple sex partners or anonymous sex partners? Yes Did the patient have close or sexual contact with a person diagnosed with MPX? No Has the patient traveled outside the [place] where MPX is endemic? No Is there a high clinical suspicion for MPX-- evidenced by one of the following No  -Unlikely to be chickenpox  -Lymphadenopathy  -Rash that present in same phase of evolution on any given body part   See flowsheet for further details and programmatic requirements.    There is no immunization history on file for this patient.   The following portions of the patient's history were reviewed and updated as appropriate: allergies, current medications, past medical history, past social history, past surgical history and problem list.  Objective:  There were no vitals filed for this  visit.  Physical Exam Exam conducted with a chaperone present Gerilyn Pilgrim, RN).  Constitutional:      Appearance: Normal appearance.  HENT:     Head: Normocephalic and atraumatic.     Comments: No nits or hair loss    Mouth/Throat:     Mouth: Mucous membranes are moist. No oral lesions.     Pharynx: Oropharynx is clear. No oropharyngeal exudate or posterior oropharyngeal erythema.  Eyes:     General:        Right eye: No discharge.        Left eye: No discharge.     Conjunctiva/sclera:     Right eye: Right conjunctiva is not injected. No exudate.    Left eye: Left conjunctiva is not injected. No exudate. Pulmonary:     Effort: Pulmonary effort is normal.  Abdominal:     General: Abdomen is flat.     Palpations: Abdomen is soft. There is no hepatomegaly or mass.     Tenderness: There is no abdominal tenderness. There is no rebound.     Hernia: There is no hernia in the left inguinal area or right inguinal area.  Genitourinary:    Pubic Area: No rash or pubic lice (no nits).      Penis: Normal. No tenderness, discharge, swelling or lesions.      Testes: Normal.     Epididymis:     Right: Normal. No mass or tenderness.     Left: Normal. No mass or tenderness.  Rectum: Normal. No tenderness (no lesions or discharge).     Comments: Penile Discharge Amount: none Color:  none Lymphadenopathy:     Head:     Right side of head: No preauricular or posterior auricular adenopathy.     Left side of head: No preauricular or posterior auricular adenopathy.     Cervical: No cervical adenopathy.     Upper Body:     Right upper body: No supraclavicular, axillary or epitrochlear adenopathy.     Left upper body: No supraclavicular, axillary or epitrochlear adenopathy.     Lower Body: No right inguinal adenopathy. No left inguinal adenopathy.  Skin:    General: Skin is warm and dry.     Findings: No lesion or rash.  Neurological:     Mental Status: He is alert and oriented to person,  place, and time.       Assessment and Plan:  Matei A Ausley is a 27 y.o. male presenting to the [facility] Department for STI screening  1. Screening examination for venereal disease  - Gram stain - Chlamydia/GC NAA, Confirmation  2. NGU (nongonococcal urethritis)  - doxycycline (VIBRA-TABS) 100 MG tablet; Take 1 tablet (100 mg total) by mouth 2 (two) times daily for 7 days.   Patient does have STI symptoms Patient accepted all screenings including  urine GC/Chlamydia. Declined screening for HIV/RPR Patient meets criteria for HepB screening? Yes. Ordered? declined Patient meets criteria for HepC screening? Yes. Ordered? declined Recommended condom use with all sex Discussed importance of condom use for STI prevent  Treat positive test results per standing order. Discussed time line for State Lab results and that patient will be called with positive results and encouraged patient to call if he had not heard in 2 weeks Recommended repeat testing in 3 months with positive results. Recommended returning for continued or worsening symptoms.   Return if symptoms worsen or fail to improve, for STI screening.  No future appointments.  Lenice Llamas, [place]

## 2023-01-20 NOTE — Progress Notes (Signed)
Pt is here for std screening. Condoms given. The patient was dispensed doxycycline today. I provided counseling today regarding the medication. We discussed the medication, the side effects and when to call clinic. Patient given the opportunity to ask questions. Questions answered.  3 Contact cards given Gaspar Garbe, RN

## 2023-01-23 LAB — CHLAMYDIA/GC NAA, CONFIRMATION
Chlamydia trachomatis, NAA: NEGATIVE
Neisseria gonorrhoeae, NAA: NEGATIVE

## 2023-02-04 ENCOUNTER — Emergency Department: Payer: Self-pay

## 2023-02-04 ENCOUNTER — Emergency Department
Admission: EM | Admit: 2023-02-04 | Discharge: 2023-02-04 | Disposition: A | Discharge status: Home / Self Care | Payer: Self-pay | Attending: Emergency Medicine | Admitting: Emergency Medicine

## 2023-02-04 ENCOUNTER — Other Ambulatory Visit: Payer: Self-pay

## 2023-02-04 DIAGNOSIS — R112 Nausea with vomiting, unspecified: Secondary | ICD-10-CM | POA: Insufficient documentation

## 2023-02-04 DIAGNOSIS — R1030 Lower abdominal pain, unspecified: Secondary | ICD-10-CM | POA: Insufficient documentation

## 2023-02-04 LAB — CBC
HCT: 46.8 % (ref 39.0–52.0)
Hemoglobin: 16.9 g/dL (ref 13.0–17.0)
MCH: 32.6 pg (ref 26.0–34.0)
MCHC: 36.1 g/dL — ABNORMAL HIGH (ref 30.0–36.0)
MCV: 90.2 fL (ref 80.0–100.0)
Platelets: 204 10*3/uL (ref 150–400)
RBC: 5.19 MIL/uL (ref 4.22–5.81)
RDW: 12.4 % (ref 11.5–15.5)
WBC: 13.6 10*3/uL — ABNORMAL HIGH (ref 4.0–10.5)
nRBC: 0 % (ref 0.0–0.2)

## 2023-02-04 LAB — URINALYSIS, ROUTINE W REFLEX MICROSCOPIC
Bilirubin Urine: NEGATIVE
Glucose, UA: NEGATIVE mg/dL
Hgb urine dipstick: NEGATIVE
Ketones, ur: NEGATIVE mg/dL
Leukocytes,Ua: NEGATIVE
Nitrite: NEGATIVE
Protein, ur: NEGATIVE mg/dL
Specific Gravity, Urine: >1.046 — ABNORMAL HIGH (ref 1.005–1.030)
pH: 7 (ref 5.0–8.0)

## 2023-02-04 LAB — COMPREHENSIVE METABOLIC PANEL
ALT: 15 U/L (ref 0–44)
AST: 28 U/L (ref 15–41)
Albumin: 4.6 g/dL (ref 3.5–5.0)
Alkaline Phosphatase: 52 U/L (ref 38–126)
Anion gap: 14 (ref 5–15)
BUN: 16 mg/dL (ref 6–20)
CO2: 17 mmol/L — ABNORMAL LOW (ref 22–32)
Calcium: 8.9 mg/dL (ref 8.9–10.3)
Chloride: 104 mmol/L (ref 98–111)
Creatinine, Ser: 0.7 mg/dL (ref 0.61–1.24)
GFR, Estimated: >60 mL/min (ref >60)
Glucose, Bld: 95 mg/dL (ref 70–99)
Potassium: 3.5 mmol/L (ref 3.5–5.1)
Sodium: 135 mmol/L (ref 135–145)
Total Bilirubin: 0.8 mg/dL (ref <1.2)
Total Protein: 7.6 g/dL (ref 6.5–8.1)

## 2023-02-04 LAB — LIPASE, BLOOD: Lipase: 26 U/L (ref 11–51)

## 2023-02-04 MED ORDER — IOHEXOL 300 MG/ML  SOLN
100.0000 mL | Freq: Once | INTRAMUSCULAR | Status: AC | PRN
  Administered 2023-02-04: 100 mL via INTRAVENOUS

## 2023-02-04 MED ORDER — ONDANSETRON 4 MG PO TBDP
4.0000 mg | ORAL_TABLET | Freq: Once | ORAL | Status: AC | PRN
  Administered 2023-02-04: 4 mg via ORAL
  Filled 2023-02-04: qty 1

## 2023-02-04 MED ORDER — PANTOPRAZOLE SODIUM 40 MG IV SOLR
40.0000 mg | Freq: Once | INTRAVENOUS | Status: AC
  Administered 2023-02-04: 40 mg via INTRAVENOUS
  Filled 2023-02-04: qty 10

## 2023-02-04 MED ORDER — DROPERIDOL 2.5 MG/ML IJ SOLN
1.2500 mg | Freq: Once | INTRAMUSCULAR | Status: AC
  Administered 2023-02-04: 1.25 mg via INTRAVENOUS
  Filled 2023-02-04: qty 2

## 2023-02-04 MED ORDER — ONDANSETRON 4 MG PO TBDP: 4.0000 mg | ORAL_TABLET | Freq: Three times a day (TID) | ORAL | 0 refills | Status: AC | PRN

## 2023-02-04 MED ORDER — SODIUM CHLORIDE 0.9 % IV BOLUS
1000.0000 mL | Freq: Once | INTRAVENOUS | Status: AC
  Administered 2023-02-04: 1000 mL via INTRAVENOUS

## 2023-02-04 NOTE — ED Triage Notes (Addendum)
Pt BIB [facility]. Pt presents with abd pain, N/V that started this AM. EMS report pt had significant amounts of ETOH last PM. Pt also states he had marijuana yesterday. Pt noted to be drooling and constantly spitting into bag during triage. A/O x4.   Vitals Signs  HR 69 167/99 SpO2 99% on R/A

## 2023-02-04 NOTE — ED Notes (Signed)
Water given and pt attempting to urinate.
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
## 2023-11-16 ENCOUNTER — Ambulatory Visit: Payer: Self-pay

## 2023-11-16 DIAGNOSIS — Z202 Contact with and (suspected) exposure to infections with a predominantly sexual mode of transmission: Secondary | ICD-10-CM

## 2023-11-16 MED ORDER — DOXYCYCLINE HYCLATE 100 MG PO TABS: 100.0000 mg | ORAL_TABLET | Freq: Two times a day (BID) | ORAL | Status: AC

## 2023-11-16 NOTE — Progress Notes (Signed)
 [facility] STI clinic [address], [address] [place] [address] Main phone: [phone]  STI screening visit  Subjective:  Cory Chaney is a 28 y.o. male being seen today for an STI screening visit. The patient reports they do not have symptoms.    Patient has the following medical conditions:  Patient Active Problem List   Diagnosis Date Noted   Pyelonephritis 11/30/2016   Chief Complaint  Patient presents with   SEXUALLY TRANSMITTED DISEASE   HPI Patient reports he was told by a sex partner that she has chlamydia. She was notified on Friday and has started treatment.  Current smoker, wants to quit. Quit Multimedia programmer provided.  See flowsheet for further details and programmatic requirements  Hyperlink available at the top of the signed note in blue.  Flow sheet content below:  Pregnancy Intention Screening Does the patient want to become pregnant in the next year?: Ok Either Way Does the patient's partner want to become pregnant in the next year?: Ok Either Way Would the patient like to discuss contraceptive options today?: N/A Reason For STD Screen STD Screening: Is a contact Have you ever had an STD?: Yes History of Antibiotic use in the past 2 weeks?: No STD Symptoms Denies all: Yes Advise Advised client to quit or stay quit. : Yes Abuse History Has patient ever been abused physically?: No Has patient ever been abused sexually?: No Does patient feel they have a problem with Anxiety?: No Does patient feel they have a problem with Depression?: No Counseling Patient counseled to use condoms with all sex: Condoms declined RTC in 2-3 weeks for test results: Yes Clinic will call if test results abnormal before test result appt.: Yes Test results given to patient Patient counseled to use condoms with all sex: Condoms declined  Screening for MPX risk: Does the patient have an unexplained rash? No Is the patient MSM? No Does  the patient endorse multiple sex partners or anonymous sex partners? No Did the patient have close or sexual contact with a person diagnosed with MPX? No Has the patient traveled outside the [place]  where MPX is endemic? No Is there a high clinical suspicion for MPX-- evidenced by one of the following No  -Unlikely to be chickenpox  -Lymphadenopathy  -Rash that present in same phase of evolution on any given body part  STI screening history: Last HIV test per patient/review of record was No results found for: HMHIVSCREEN  Lab Results  Component Value Date   HIV Non Reactive 11/30/2016   Last HEPC test per patient/review of record was No results found for: HMHEPCSCREEN No components found for: HEPC   Last HEPB test per patient/review of record was No components found for: HMHEPBSCREEN   Fertility: Does the patient or their partner desires a pregnancy in the next year? No   There is no immunization history on file for this patient.  The following portions of the patient's history were reviewed and updated as appropriate: allergies, current medications, past medical history, past social history, past surgical history and problem list.  Objective:  There were no vitals filed for this visit.  Physical Exam Constitutional:      Appearance: Normal appearance. He is normal weight.  HENT:     Head: Normocephalic.     Mouth/Throat:     Lips: Pink. No lesions.     Mouth: Mucous membranes are moist.  Eyes:     General: No scleral icterus.  Right eye: No discharge.        Left eye: No discharge.  Pulmonary:     Effort: Pulmonary effort is normal.  Abdominal:     General: Abdomen is flat.     Palpations: Abdomen is soft.  Skin:    General: Skin is warm and dry.     Findings: No rash.  Neurological:     General: No focal deficit present.     Mental Status: He is alert.  Psychiatric:        Mood and Affect: Mood normal.        Behavior: Behavior normal.    Assessment  and Plan:  Cory Chaney is a 28 y.o. male presenting to the [facility] for STI screening  1. Chlamydia contact (Primary)  - Chlamydia/GC NAA, Confirmation - doxycycline  (VIBRA -TABS) 100 MG tablet; Take 1 tablet (100 mg total) by mouth 2 (two) times daily for 7 days.  - Discussed no sex until he and his partner have completed treatment  Patient does not have STI symptoms Patient accepted the following screenings: urine CT/GC Declines all blood tests.  Recommended condom use with all sex Discussed importance of condom use for STI prevention  Treat positive test results per standing order. Discussed time line for [facility] results and that patient will be called with positive results and encouraged patient to call if he had not heard in 2 weeks Recommended repeat testing in 3 months with positive results. Recommended returning for continued or worsening symptoms.   Return in about 3 months (around 02/15/2024).  No future appointments.  Damien FORBES Satchel, NP

## 2023-11-16 NOTE — Progress Notes (Signed)
 Pt report contact to Chlamydia. The patient was dispensed  Doxycycline  100 mg capsules 2x/day for 7 days. I provided counseling today regarding the medication. We discussed the medication, the side effects and when to call clinic. Patient given the opportunity to ask questions. Questions answered.Condoms declined and brochure given. Wilkie Drought, RN.

## 2023-11-18 LAB — CHLAMYDIA/GC NAA, CONFIRMATION
Chlamydia trachomatis, NAA: NEGATIVE
Neisseria gonorrhoeae, NAA: NEGATIVE

## 2024-04-23 ENCOUNTER — Ambulatory Visit: Payer: Self-pay
